# Patient Record
Sex: Male | Born: 2000 | Race: Black or African American | Hispanic: No | Smoking: Former smoker
Health system: Southern US, Community
[De-identification: ages and names within clinical notes are randomized; demographics above are authoritative.]

## PROBLEM LIST (undated history)

## (undated) DIAGNOSIS — K219 Gastro-esophageal reflux disease without esophagitis: Secondary | ICD-10-CM

## (undated) DIAGNOSIS — Z9710 Presence of artificial limb (complete) (partial), unspecified: Secondary | ICD-10-CM

## (undated) DIAGNOSIS — F4310 Post-traumatic stress disorder, unspecified: Secondary | ICD-10-CM

## (undated) DIAGNOSIS — E119 Type 2 diabetes mellitus without complications: Secondary | ICD-10-CM

## (undated) DIAGNOSIS — Z89512 Acquired absence of left leg below knee: Secondary | ICD-10-CM

## (undated) DIAGNOSIS — F319 Bipolar disorder, unspecified: Secondary | ICD-10-CM

## (undated) DIAGNOSIS — J45909 Unspecified asthma, uncomplicated: Secondary | ICD-10-CM

## (undated) DIAGNOSIS — J302 Other seasonal allergic rhinitis: Secondary | ICD-10-CM

## (undated) DIAGNOSIS — E669 Obesity, unspecified: Secondary | ICD-10-CM

## (undated) DIAGNOSIS — F32A Depression, unspecified: Secondary | ICD-10-CM

## (undated) HISTORY — PX: LEG AMPUTATION BELOW KNEE: SHX694

## (undated) HISTORY — PX: OTHER SURGICAL HISTORY: SHX169

## (undated) HISTORY — PX: BELOW KNEE LEG AMPUTATION: SUR23

---
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
## 2022-05-13 ENCOUNTER — Ambulatory Visit (HOSPITAL_COMMUNITY)
Admission: EM | Admit: 2022-05-13 | Discharge: 2022-05-13 | Discharge status: Against Medical Advice | Payer: Medicaid Other
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
## 2022-07-15 ENCOUNTER — Ambulatory Visit (HOSPITAL_COMMUNITY): Payer: Self-pay

## 2022-07-22 ENCOUNTER — Ambulatory Visit (HOSPITAL_COMMUNITY)
Admission: EM | Admit: 2022-07-22 | Discharge: 2022-07-22 | Disposition: A | Discharge status: Home / Self Care | Payer: Medicaid Other | Attending: Family Medicine | Admitting: Family Medicine

## 2022-07-22 ENCOUNTER — Ambulatory Visit (HOSPITAL_COMMUNITY): Payer: Self-pay

## 2022-07-22 ENCOUNTER — Inpatient Hospital Stay: Admission: RE | Admit: 2022-07-22 | Payer: Self-pay | Source: Ambulatory Visit

## 2022-07-22 ENCOUNTER — Encounter (HOSPITAL_COMMUNITY): Payer: Self-pay | Admitting: Emergency Medicine

## 2022-07-22 DIAGNOSIS — L02415 Cutaneous abscess of right lower limb: Secondary | ICD-10-CM | POA: Diagnosis present

## 2022-07-22 DIAGNOSIS — Z113 Encounter for screening for infections with a predominantly sexual mode of transmission: Secondary | ICD-10-CM | POA: Insufficient documentation

## 2022-07-22 MED ORDER — HYDROCODONE-ACETAMINOPHEN 5-325 MG PO TABS: 1.0000 | ORAL_TABLET | Freq: Four times a day (QID) | ORAL | 0 refills | Status: DC | PRN

## 2022-07-22 MED ORDER — DOXYCYCLINE HYCLATE 100 MG PO CAPS: 100.0000 mg | ORAL_CAPSULE | Freq: Two times a day (BID) | ORAL | 0 refills | Status: DC

## 2022-07-22 NOTE — Discharge Instructions (Signed)
We have sent testing for sexually transmitted infections. We will notify you of any positive results once they are received. If required, we will prescribe any medications you might need.  Please refrain from all sexual activity for at least the next seven days.  

## 2022-07-22 NOTE — ED Triage Notes (Signed)
Pt requesting STD testing. Denies s/s or exposure.   Has boil on right inner upper thigh that would like checked out. Denies drainage.

## 2022-07-23 NOTE — ED Provider Notes (Signed)
[facility]   [id number] 07/22/22 Arrival Time: 1854  ASSESSMENT & PLAN:  1. Abscess of right thigh   2. Screening for STDs (sexually transmitted diseases)     Incision and Drainage Procedure Note  Anesthesia: 2% plain lidocaine  Procedure Details  The procedure, risks and complications have been discussed in detail (including, but not limited to pain and bleeding) with the patient.  The skin induration was prepped and draped in the usual fashion. After adequate local anesthesia, I&D with a #11 blade was performed on the right inner upper thigh with bloody, purulent drainage.  EBL: minimal Drains: none Packing: 1/4" iodoform Condition: Tolerated procedure well Complications: none.  Meds ordered this encounter  Medications   doxycycline (VIBRAMYCIN) 100 MG capsule    Sig: Take 1 capsule (100 mg total) by mouth 2 (two) times daily.    Dispense:  14 capsule    Refill:  0   HYDROcodone-acetaminophen (NORCO/VICODIN) 5-325 MG tablet    Sig: Take 1 tablet by mouth every 6 (six) hours as needed for moderate pain or severe pain.    Dispense:  8 tablet    Refill:  0   Labs Pending  CYTOLOGY, (ORAL, ANAL, URETHRAL) ANCILLARY ONLY   Wound care instructions discussed and given in written format. To return in 48 hours for wound check.  Finish all antibiotics. OTC analgesics as needed.  Reviewed expectations re: course of current medical issues. Questions answered. Outlined signs and symptoms indicating need for more acute intervention. Patient verbalized understanding. After Visit Summary given.   SUBJECTIVE:  Andrew Pittman is a 22 y.o. male who presents with a possible infection of his R upper inner thigh. Onset gradual, approximately several days ago without active drainage and without active bleeding. Symptoms have gradually worsened since beginning. Fever: absent. OTC/home treatment: none.   OBJECTIVE:  Vitals:   07/22/22 1911  BP: 130/76  Pulse: 60   Resp: 15  Temp: 98.9 F (37.2 C)  TempSrc: Oral  SpO2: 96%     General appearance: alert; no distress Skin: approx 1 x 2 cm induration of his R upper inner thigh; tender to touch; no active drainage or bleeding Psychological: alert and cooperative; normal mood and affect  Allergies  Allergen Reactions   Shellfish Allergy Swelling and Rash    "shrimp"    Past Medical History:  Diagnosis Date   Acid reflux    Asthma    Diabetes mellitus without complication (HCC)    H/O implantation of prosthetic limb device    Seasonal allergies    Social History   Socioeconomic History   Marital status: Single    Spouse name: Not on file   Number of children: Not on file   Years of education: Not on file   Highest education level: Not on file  Occupational History   Not on file  Tobacco Use   Smoking status: Every Day   Smokeless tobacco: Never  Substance and Sexual Activity   Alcohol use: No   Drug use: Yes    Types: Marijuana   Sexual activity: Never  Other Topics Concern   Not on file  Social History Narrative   ** Merged History Encounter **       Social Determinants of Health   Financial Resource Strain: Not on file  Food Insecurity: Not on file  Transportation Needs: Not on file  Physical Activity: Not on file  Stress: Not on file  Social Connections: Not on file   No  family history on file. Past Surgical History:  Procedure Laterality Date   BELOW KNEE LEG AMPUTATION Left    LEG AMPUTATION BELOW KNEE     metal plate     to right arm and left hip            Mardella Layman, MD 07/23/22 [phone]

## 2022-07-24 ENCOUNTER — Telehealth (HOSPITAL_COMMUNITY): Payer: Self-pay | Admitting: Emergency Medicine

## 2022-07-24 LAB — CYTOLOGY, (ORAL, ANAL, URETHRAL) ANCILLARY ONLY
Chlamydia: NEGATIVE
Comment: NEGATIVE
Comment: NEGATIVE
Comment: NORMAL
Neisseria Gonorrhea: NEGATIVE
Trichomonas: POSITIVE — AB

## 2022-07-26 ENCOUNTER — Telehealth: Payer: Self-pay

## 2022-07-26 MED ORDER — METRONIDAZOLE 500 MG PO TABS: 2000.0000 mg | ORAL_TABLET | Freq: Once | ORAL | 0 refills | Status: AC

## 2022-07-26 MED ORDER — METRONIDAZOLE 500 MG PO TABS: 2000.0000 mg | ORAL_TABLET | Freq: Once | ORAL | 0 refills | Status: DC

## 2022-07-26 NOTE — Telephone Encounter (Signed)
Rx sent to different pharmacy per pt request.

## 2022-07-26 NOTE — Telephone Encounter (Signed)
Per protocol, pt requires tx with Metronidazole. Reviewed with patient, verified pharmacy, prescription sent

## 2022-08-18 ENCOUNTER — Ambulatory Visit (HOSPITAL_COMMUNITY): Payer: Self-pay

## 2022-08-19 ENCOUNTER — Ambulatory Visit (HOSPITAL_COMMUNITY): Payer: Self-pay

## 2022-08-29 ENCOUNTER — Encounter (HOSPITAL_COMMUNITY): Payer: Self-pay | Admitting: Emergency Medicine

## 2022-08-29 ENCOUNTER — Ambulatory Visit (HOSPITAL_COMMUNITY)
Admission: EM | Admit: 2022-08-29 | Discharge: 2022-08-29 | Disposition: A | Discharge status: Home / Self Care | Payer: Medicaid Other | Attending: Physician Assistant | Admitting: Physician Assistant

## 2022-08-29 ENCOUNTER — Other Ambulatory Visit: Payer: Self-pay

## 2022-08-29 DIAGNOSIS — Z202 Contact with and (suspected) exposure to infections with a predominantly sexual mode of transmission: Secondary | ICD-10-CM | POA: Diagnosis not present

## 2022-08-29 DIAGNOSIS — T162XXA Foreign body in left ear, initial encounter: Secondary | ICD-10-CM | POA: Diagnosis not present

## 2022-08-29 MED ORDER — LIDOCAINE HCL (PF) 1 % IJ SOLN
INTRAMUSCULAR | Status: AC
  Filled 2022-08-29: qty 2

## 2022-08-29 MED ORDER — CEFTRIAXONE SODIUM 500 MG IJ SOLR: 500.0000 mg | INTRAMUSCULAR | Status: DC

## 2022-08-29 MED ORDER — DOXYCYCLINE HYCLATE 100 MG PO CAPS: 100.0000 mg | ORAL_CAPSULE | Freq: Two times a day (BID) | ORAL | 0 refills | Status: AC

## 2022-08-29 NOTE — Discharge Instructions (Addendum)
Keep ear clean, clean with regular soap and water, can keep covered If you develop increased swelling, pain, or drainage return for evaluation   Will call with test results and changed treatment plan if indicated Abstain from sexual activity until test results are back and all medication completed.

## 2022-08-29 NOTE — ED Triage Notes (Signed)
Denies symptoms .  Partner positive for gonorrhea and chlamydia.  Patient has the back of a earring in left ear lobe. Patient found out about this 2 days ago.  Patient tried to manipulate foreign body with tweezers.
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
## 2022-08-31 LAB — CYTOLOGY, (ORAL, ANAL, URETHRAL) ANCILLARY ONLY
Chlamydia: NEGATIVE
Comment: NEGATIVE
Comment: NEGATIVE
Comment: NORMAL
Neisseria Gonorrhea: NEGATIVE
Trichomonas: NEGATIVE

## 2022-10-06 ENCOUNTER — Ambulatory Visit (INDEPENDENT_AMBULATORY_CARE_PROVIDER_SITE_OTHER): Payer: Medicaid Other | Admitting: Orthopedic Surgery

## 2022-10-06 DIAGNOSIS — Z89512 Acquired absence of left leg below knee: Secondary | ICD-10-CM

## 2022-10-08 ENCOUNTER — Encounter: Payer: Self-pay | Admitting: Orthopedic Surgery
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
## 2022-11-18 ENCOUNTER — Ambulatory Visit (HOSPITAL_COMMUNITY): Payer: Medicaid Other

## 2022-11-23 ENCOUNTER — Ambulatory Visit (HOSPITAL_COMMUNITY): Payer: Medicaid Other

## 2022-11-27 ENCOUNTER — Encounter (HOSPITAL_COMMUNITY): Payer: Self-pay

## 2022-11-27 ENCOUNTER — Ambulatory Visit (HOSPITAL_COMMUNITY)
Admission: EM | Admit: 2022-11-27 | Discharge: 2022-11-27 | Disposition: A | Discharge status: Home / Self Care | Payer: Medicaid Other | Attending: Emergency Medicine | Admitting: Emergency Medicine

## 2022-11-27 DIAGNOSIS — Z202 Contact with and (suspected) exposure to infections with a predominantly sexual mode of transmission: Secondary | ICD-10-CM | POA: Insufficient documentation

## 2022-11-27 DIAGNOSIS — Z113 Encounter for screening for infections with a predominantly sexual mode of transmission: Secondary | ICD-10-CM | POA: Diagnosis present

## 2022-11-27 LAB — HIV ANTIBODY (ROUTINE TESTING W REFLEX): HIV Screen 4th Generation wRfx: NONREACTIVE
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
## 2022-11-27 NOTE — Discharge Instructions (Signed)
Today you have been screened for gonorrhea, chlamydia, trichomoniasis, HIV and syphilis.  Please return to clinic if you develop any painful lesions or sores, as then we can test you for herpes.  Abstain from intercourse until results have been received.  If you have intercourse in the future suggest a condom, this is the best way to prevent sexually transmitted infections.

## 2022-11-27 NOTE — ED Triage Notes (Signed)
Pt presents to the office for std testing. Pt denies any symptoms.

## 2022-11-28 ENCOUNTER — Ambulatory Visit (HOSPITAL_COMMUNITY): Payer: Medicaid Other

## 2022-11-28 LAB — RPR: RPR Ser Ql: NONREACTIVE

## 2022-11-30 ENCOUNTER — Telehealth (HOSPITAL_COMMUNITY): Payer: Self-pay | Admitting: Emergency Medicine

## 2022-11-30 LAB — CYTOLOGY, (ORAL, ANAL, URETHRAL) ANCILLARY ONLY
Chlamydia: POSITIVE — AB
Comment: NEGATIVE
Comment: NEGATIVE
Comment: NORMAL
Neisseria Gonorrhea: NEGATIVE
Trichomonas: NEGATIVE

## 2022-11-30 MED ORDER — DOXYCYCLINE HYCLATE 100 MG PO CAPS: 100.0000 mg | ORAL_CAPSULE | Freq: Two times a day (BID) | ORAL | 0 refills | Status: DC

## 2022-11-30 NOTE — Telephone Encounter (Signed)
Sent in doxycycline for positive chlamydia results.

## 2023-01-17 ENCOUNTER — Ambulatory Visit (HOSPITAL_COMMUNITY)
Admission: EM | Admit: 2023-01-17 | Discharge: 2023-01-18 | Disposition: A | Discharge status: Home / Self Care | Payer: Medicaid Other | Attending: Nurse Practitioner | Admitting: Nurse Practitioner

## 2023-01-17 DIAGNOSIS — F331 Major depressive disorder, recurrent, moderate: Secondary | ICD-10-CM

## 2023-01-17 MED ORDER — DIVALPROEX SODIUM 250 MG PO DR TAB: 250.0000 mg | DELAYED_RELEASE_TABLET | Freq: Two times a day (BID) | ORAL | 0 refills | Status: DC

## 2023-01-17 MED ORDER — MIRTAZAPINE 7.5 MG PO TABS: 7.5000 mg | ORAL_TABLET | Freq: Every day | ORAL | 0 refills | Status: DC
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
## 2023-01-17 NOTE — Discharge Instructions (Signed)
°  Discharge recommendations:  Patient is to take medications as prescribed. Please see information for follow-up appointment with psychiatry and therapy. Please follow up with your primary care provider for all medical related needs.  Follow up at [facility] outpatient clinic for ongoing medication management and outpatient therapy services.  Therapy: We recommend that patient participate in individual therapy to address mental health concerns.  Medications: The patient or guardian is to contact a medical professional and/or outpatient provider to address any new side effects that develop. The patient or guardian should update outpatient providers of any new medications and/or medication changes.   Atypical antipsychotics: If you are prescribed an atypical antipsychotic, it is recommended that your height, weight, BMI, blood pressure, fasting lipid panel, and fasting blood sugar be monitored by your outpatient providers.  Safety:  The patient should abstain from use of illicit substances/drugs and abuse of any medications. If symptoms worsen or do not continue to improve or if the patient becomes actively suicidal or homicidal then it is recommended that the patient return to the closest hospital emergency department, the [facility], or call 911 for further evaluation and treatment. National Suicide Prevention Lifeline 1-800-SUICIDE or 540-695-9722.  About 988 988 offers 24/7 access to trained crisis counselors who can help people experiencing mental health-related distress. People can call or text 988 or chat 988lifeline.org for themselves or if they are worried about a loved one who may need crisis support.  Crisis Mobile: [facility]:                     (424)731-1125 (for crisis response 24 hours a day) [facility] Hotline:                                            (781) 154-7965
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
## 2023-03-17 ENCOUNTER — Emergency Department (HOSPITAL_COMMUNITY): Payer: Medicaid Other

## 2023-03-17 ENCOUNTER — Emergency Department (HOSPITAL_COMMUNITY): Payer: Medicaid Other | Admitting: Anesthesiology

## 2023-03-17 ENCOUNTER — Encounter (HOSPITAL_COMMUNITY)
Admission: EM | Disposition: A | Discharge status: Other Acute Inpatient Hospital | Payer: Self-pay | Source: Home / Self Care

## 2023-03-17 ENCOUNTER — Other Ambulatory Visit: Payer: Self-pay

## 2023-03-17 ENCOUNTER — Inpatient Hospital Stay (HOSPITAL_COMMUNITY)
Admission: EM | Admit: 2023-03-17 | Discharge: 2023-03-20 | DRG: 500 | Disposition: A | Discharge status: Other Acute Inpatient Hospital | Payer: Medicaid Other | Attending: General Surgery | Admitting: General Surgery

## 2023-03-17 DIAGNOSIS — S5412XA Injury of median nerve at forearm level, left arm, initial encounter: Secondary | ICD-10-CM | POA: Diagnosis present

## 2023-03-17 DIAGNOSIS — Y249XXA Unspecified firearm discharge, undetermined intent, initial encounter: Secondary | ICD-10-CM

## 2023-03-17 DIAGNOSIS — R451 Restlessness and agitation: Secondary | ICD-10-CM | POA: Diagnosis present

## 2023-03-17 DIAGNOSIS — S52352C Displaced comminuted fracture of shaft of radius, left arm, initial encounter for open fracture type IIIA, IIIB, or IIIC: Principal | ICD-10-CM | POA: Diagnosis present

## 2023-03-17 DIAGNOSIS — W3400XA Accidental discharge from unspecified firearms or gun, initial encounter: Secondary | ICD-10-CM | POA: Diagnosis not present

## 2023-03-17 DIAGNOSIS — Z23 Encounter for immunization: Secondary | ICD-10-CM

## 2023-03-17 DIAGNOSIS — Z89512 Acquired absence of left leg below knee: Secondary | ICD-10-CM | POA: Diagnosis not present

## 2023-03-17 DIAGNOSIS — S52252C Displaced comminuted fracture of shaft of ulna, left arm, initial encounter for open fracture type IIIA, IIIB, or IIIC: Secondary | ICD-10-CM | POA: Diagnosis present

## 2023-03-17 DIAGNOSIS — I959 Hypotension, unspecified: Secondary | ICD-10-CM | POA: Diagnosis not present

## 2023-03-17 DIAGNOSIS — S51802A Unspecified open wound of left forearm, initial encounter: Secondary | ICD-10-CM

## 2023-03-17 DIAGNOSIS — D62 Acute posthemorrhagic anemia: Secondary | ICD-10-CM | POA: Diagnosis present

## 2023-03-17 DIAGNOSIS — J9602 Acute respiratory failure with hypercapnia: Secondary | ICD-10-CM | POA: Diagnosis present

## 2023-03-17 DIAGNOSIS — S51832A Puncture wound without foreign body of left forearm, initial encounter: Secondary | ICD-10-CM | POA: Diagnosis present

## 2023-03-17 DIAGNOSIS — T1490XA Injury, unspecified, initial encounter: Principal | ICD-10-CM

## 2023-03-17 HISTORY — DX: Post-traumatic stress disorder, unspecified: F43.10

## 2023-03-17 HISTORY — DX: Obesity, unspecified: E66.9

## 2023-03-17 HISTORY — PX: FASCIOTOMY: SHX132

## 2023-03-17 HISTORY — PX: I & D EXTREMITY: SHX5045

## 2023-03-17 HISTORY — PX: CARPAL TUNNEL RELEASE: SHX101

## 2023-03-17 HISTORY — PX: EXTERNAL FIXATION ARM: SHX1552

## 2023-03-17 HISTORY — DX: Acquired absence of left leg below knee: Z89.512

## 2023-03-17 HISTORY — PX: APPLICATION OF WOUND VAC: SHX5189

## 2023-03-17 HISTORY — DX: Bipolar disorder, unspecified: F31.9

## 2023-03-17 HISTORY — DX: Depression, unspecified: F32.A

## 2023-03-17 LAB — COMPREHENSIVE METABOLIC PANEL
ALT: 23 U/L (ref 0–44)
AST: 25 U/L (ref 15–41)
Albumin: 3.7 g/dL (ref 3.5–5.0)
Alkaline Phosphatase: 50 U/L (ref 38–126)
Anion gap: 17 — ABNORMAL HIGH (ref 5–15)
BUN: 12 mg/dL (ref 6–20)
CO2: 18 mmol/L — ABNORMAL LOW (ref 22–32)
Calcium: 9.1 mg/dL (ref 8.9–10.3)
Chloride: 106 mmol/L (ref 98–111)
Creatinine, Ser: 1.06 mg/dL (ref 0.61–1.24)
GFR, Estimated: >60 mL/min (ref >60)
Glucose, Bld: 131 mg/dL — ABNORMAL HIGH (ref 70–99)
Potassium: 3.7 mmol/L (ref 3.5–5.1)
Sodium: 141 mmol/L (ref 135–145)
Total Bilirubin: 0.4 mg/dL (ref 0.0–1.2)
Total Protein: 6.2 g/dL — ABNORMAL LOW (ref 6.5–8.1)

## 2023-03-17 LAB — CBC
HCT: 44.8 % (ref 39.0–52.0)
Hemoglobin: 14.3 g/dL (ref 13.0–17.0)
MCH: 27.2 pg (ref 26.0–34.0)
MCHC: 31.9 g/dL (ref 30.0–36.0)
MCV: 85.2 fL (ref 80.0–100.0)
Platelets: 296 10*3/uL (ref 150–400)
RBC: 5.26 MIL/uL (ref 4.22–5.81)
RDW: 14.5 % (ref 11.5–15.5)
WBC: 8.3 10*3/uL (ref 4.0–10.5)
nRBC: 0 % (ref 0.0–0.2)

## 2023-03-17 LAB — ABO/RH: ABO/RH(D): O POS

## 2023-03-17 LAB — PROTIME-INR
INR: 1.1 (ref 0.8–1.2)
Prothrombin Time: 14 s (ref 11.4–15.2)

## 2023-03-17 LAB — I-STAT CG4 LACTIC ACID, ED: Lactic Acid, Venous: 3.6 mmol/L (ref 0.5–1.9)

## 2023-03-17 LAB — ETHANOL: Alcohol, Ethyl (B): <10 mg/dL (ref <10)

## 2023-03-17 SURGERY — IRRIGATION AND DEBRIDEMENT EXTREMITY
Anesthesia: General | Site: Arm Lower | Laterality: Left

## 2023-03-17 MED ORDER — MIDAZOLAM HCL 2 MG/2ML IJ SOLN
INTRAMUSCULAR | Status: DC | PRN
  Administered 2023-03-17: 2 mg via INTRAVENOUS

## 2023-03-17 MED ORDER — SODIUM CHLORIDE 0.9 % IV SOLN: INTRAVENOUS | Status: DC | PRN

## 2023-03-17 MED ORDER — PROPOFOL 1000 MG/100ML IV EMUL
5.0000 ug/kg/min | INTRAVENOUS | Status: DC
  Administered 2023-03-18: 75 ug/kg/min via INTRAVENOUS
  Administered 2023-03-18: 70 ug/kg/min via INTRAVENOUS
  Administered 2023-03-18: 60 ug/kg/min via INTRAVENOUS
  Administered 2023-03-18: 55 ug/kg/min via INTRAVENOUS
  Administered 2023-03-18: 75 ug/kg/min via INTRAVENOUS
  Administered 2023-03-18: 55 ug/kg/min via INTRAVENOUS
  Administered 2023-03-18: 70 ug/kg/min via INTRAVENOUS
  Administered 2023-03-18: 60 ug/kg/min via INTRAVENOUS
  Administered 2023-03-18: 55 ug/kg/min via INTRAVENOUS
  Administered 2023-03-19: 45 ug/kg/min via INTRAVENOUS
  Administered 2023-03-19: 50 ug/kg/min via INTRAVENOUS
  Filled 2023-03-17 (×10): qty 100

## 2023-03-17 MED ORDER — FENTANYL CITRATE PF 50 MCG/ML IJ SOSY
50.0000 ug | PREFILLED_SYRINGE | Freq: Once | INTRAMUSCULAR | Status: AC
  Administered 2023-03-17: 50 ug via INTRAVENOUS

## 2023-03-17 MED ORDER — MIDAZOLAM HCL 2 MG/2ML IJ SOLN
INTRAMUSCULAR | Status: AC
  Filled 2023-03-17: qty 2

## 2023-03-17 MED ORDER — ROCURONIUM BROMIDE 10 MG/ML (PF) SYRINGE
PREFILLED_SYRINGE | INTRAVENOUS | Status: AC
  Filled 2023-03-17: qty 10

## 2023-03-17 MED ORDER — FENTANYL CITRATE (PF) 250 MCG/5ML IJ SOLN
INTRAMUSCULAR | Status: AC
  Filled 2023-03-17: qty 5

## 2023-03-17 MED ORDER — CEFAZOLIN SODIUM-DEXTROSE 2-3 GM-%(50ML) IV SOLR
INTRAVENOUS | Status: DC | PRN
  Administered 2023-03-17: 2 g via INTRAVENOUS
  Administered 2023-03-17: 1 g via INTRAVENOUS
  Administered 2023-03-18: 2 g via INTRAVENOUS

## 2023-03-17 MED ORDER — 0.9 % SODIUM CHLORIDE (POUR BTL) OPTIME
TOPICAL | Status: DC | PRN
  Administered 2023-03-17: 1000 mL

## 2023-03-17 MED ORDER — FENTANYL CITRATE (PF) 250 MCG/5ML IJ SOLN
INTRAMUSCULAR | Status: DC | PRN
Start: 2023-03-17 — End: 2023-03-18
  Administered 2023-03-17 (×3): 50 ug via INTRAVENOUS

## 2023-03-17 MED ORDER — ROCURONIUM BROMIDE 10 MG/ML (PF) SYRINGE
PREFILLED_SYRINGE | INTRAVENOUS | Status: DC | PRN
  Administered 2023-03-17 (×2): 100 mg via INTRAVENOUS
  Administered 2023-03-18: 50 mg via INTRAVENOUS

## 2023-03-17 MED ORDER — BUPIVACAINE HCL (PF) 0.25 % IJ SOLN
INTRAMUSCULAR | Status: AC
  Filled 2023-03-17: qty 30

## 2023-03-17 MED ORDER — DEXAMETHASONE SODIUM PHOSPHATE 10 MG/ML IJ SOLN
INTRAMUSCULAR | Status: DC | PRN
  Administered 2023-03-17: 10 mg via INTRAVENOUS

## 2023-03-17 MED ORDER — DEXTROSE 5 % IV SOLN
INTRAVENOUS | Status: DC | PRN
  Administered 2023-03-17: 2 g via INTRAVENOUS

## 2023-03-17 MED ORDER — IOHEXOL 350 MG/ML SOLN
75.0000 mL | Freq: Once | INTRAVENOUS | Status: AC | PRN
  Administered 2023-03-17: 75 mL via INTRAVENOUS

## 2023-03-17 MED ORDER — FENTANYL BOLUS VIA INFUSION
50.0000 ug | INTRAVENOUS | Status: DC | PRN
  Administered 2023-03-18: 100 ug via INTRAVENOUS

## 2023-03-17 MED ORDER — CEFTRIAXONE SODIUM 2 G IJ SOLR
INTRAMUSCULAR | Status: AC
  Filled 2023-03-17: qty 20

## 2023-03-17 MED ORDER — LACTATED RINGERS IV SOLN: INTRAVENOUS | Status: DC | PRN

## 2023-03-17 MED ORDER — ROCURONIUM BROMIDE 10 MG/ML (PF) SYRINGE
PREFILLED_SYRINGE | INTRAVENOUS | Status: AC | PRN
  Administered 2023-03-17: 100 mg via INTRAVENOUS

## 2023-03-17 MED ORDER — ETOMIDATE 2 MG/ML IV SOLN
INTRAVENOUS | Status: AC | PRN
  Administered 2023-03-17: 30 mg via INTRAVENOUS

## 2023-03-17 MED ORDER — PROPOFOL 1000 MG/100ML IV EMUL
0.0000 ug/kg/min | INTRAVENOUS | Status: DC
  Administered 2023-03-17: 20 ug/kg/min via INTRAVENOUS

## 2023-03-17 MED ORDER — FENTANYL CITRATE PF 50 MCG/ML IJ SOSY
PREFILLED_SYRINGE | INTRAMUSCULAR | Status: AC
  Filled 2023-03-17: qty 2

## 2023-03-17 MED ORDER — TETANUS-DIPHTH-ACELL PERTUSSIS 5-2.5-18.5 LF-MCG/0.5 IM SUSY
0.5000 mL | PREFILLED_SYRINGE | Freq: Once | INTRAMUSCULAR | Status: AC
  Administered 2023-03-17: 0.5 mL via INTRAMUSCULAR

## 2023-03-17 MED ORDER — SODIUM CHLORIDE 0.9 % IR SOLN
Status: DC | PRN
  Administered 2023-03-17: 9000 mL

## 2023-03-17 MED ORDER — ONDANSETRON HCL 4 MG/2ML IJ SOLN
INTRAMUSCULAR | Status: DC | PRN
  Administered 2023-03-17: 4 mg via INTRAVENOUS

## 2023-03-17 MED ORDER — FENTANYL CITRATE (PF) 100 MCG/2ML IJ SOLN
INTRAMUSCULAR | Status: AC | PRN
  Administered 2023-03-17: 100 ug via INTRAVENOUS

## 2023-03-17 MED ORDER — FENTANYL 2500MCG IN NS 250ML (10MCG/ML) PREMIX INFUSION
50.0000 ug/h | INTRAVENOUS | Status: DC
  Administered 2023-03-17 – 2023-03-18 (×2): 150 ug/h via INTRAVENOUS
  Administered 2023-03-18: 175 ug/h via INTRAVENOUS
  Filled 2023-03-17 (×3): qty 250

## 2023-03-17 MED ORDER — ALBUMIN HUMAN 5 % IV SOLN: INTRAVENOUS | Status: DC | PRN

## 2023-03-17 SURGICAL SUPPLY — 68 items
BAG COUNTER SPONGE SURGICOUNT (BAG) ×3 IMPLANT
BAR GLASS FIBER EXFX 11X150 (EXFIX) ×1 IMPLANT
BAR GLASS FIBER EXFX 11X200 (EXFIX) ×1 IMPLANT
BLADE SURG 15 STRL LF DISP TIS (BLADE) ×2 IMPLANT
BNDG ELASTIC 3INX 5YD STR LF (GAUZE/BANDAGES/DRESSINGS) ×3 IMPLANT
BNDG ELASTIC 4X5.8 VLCR NS LF (GAUZE/BANDAGES/DRESSINGS) ×1 IMPLANT
BNDG ELASTIC 4X5.8 VLCR STR LF (GAUZE/BANDAGES/DRESSINGS) ×3 IMPLANT
BNDG ESMARK 4X9 LF (GAUZE/BANDAGES/DRESSINGS) IMPLANT
BNDG GAUZE DERMACEA FLUFF 4 (GAUZE/BANDAGES/DRESSINGS) ×6 IMPLANT
CANISTER WOUND CARE 500ML ATS (WOUND CARE) ×2 IMPLANT
CLAMP BLUE BAR TO BAR (EXFIX) ×1 IMPLANT
CLAMP BLUE BAR TO PIN (EXFIX) ×3 IMPLANT
CLAMP PIN BLUE 45MM (EXFIX) ×1 IMPLANT
CONNECTOR Y ATS VAC SYSTEM (MISCELLANEOUS) ×1 IMPLANT
CORD BIPOLAR FORCEPS 12FT (ELECTRODE) ×1 IMPLANT
COVER SURGICAL LIGHT HANDLE (MISCELLANEOUS) ×6 IMPLANT
CUFF TOURN SGL QUICK 18X4 (TOURNIQUET CUFF) ×3 IMPLANT
DRAPE C-ARM 42X72 X-RAY (DRAPES) ×1 IMPLANT
DRAPE C-ARMOR (DRAPES) ×1 IMPLANT
DRAPE DERMATAC (DRAPES) ×2 IMPLANT
DRAPE INCISE IOBAN 66X45 STRL (DRAPES) ×2 IMPLANT
DRAPE SURG 17X23 STRL (DRAPES) ×3 IMPLANT
DRSG VAC GRANUFOAM LG (GAUZE/BANDAGES/DRESSINGS) ×1 IMPLANT
DRSG VAC GRANUFOAM MED (GAUZE/BANDAGES/DRESSINGS) ×1 IMPLANT
DURAPREP 26ML APPLICATOR (WOUND CARE) ×3 IMPLANT
GAUZE PACKING IODOFORM 1/4X15 (PACKING) IMPLANT
GAUZE SPONGE 4X4 12PLY STRL (GAUZE/BANDAGES/DRESSINGS) ×6 IMPLANT
GAUZE SPONGE 4X4 12PLY STRL LF (GAUZE/BANDAGES/DRESSINGS) ×1 IMPLANT
GAUZE STRETCH 2X75IN STRL (MISCELLANEOUS) IMPLANT
GAUZE XEROFORM 1X8 LF (GAUZE/BANDAGES/DRESSINGS) ×3 IMPLANT
GLOVE INDICATOR 7.5 STRL GRN (GLOVE) ×2 IMPLANT
GLOVE PROTEXIS LATEX SZ 7.5 (GLOVE) ×12 IMPLANT
GLOVE SURG LATEX 7.5 PF (GLOVE) IMPLANT
GLOVE SURG SS PI 7.0 STRL IVOR (GLOVE) ×3 IMPLANT
GLOVE SURG SYN 8.0 (GLOVE) ×3 IMPLANT
GLOVE SURG SYN 8.0 PF PI (GLOVE) ×2 IMPLANT
GOWN STRL REUS W/ TWL LRG LVL3 (GOWN DISPOSABLE) ×4 IMPLANT
GOWN STRL REUS W/ TWL XL LVL3 (GOWN DISPOSABLE) ×4 IMPLANT
HALF PIN 4MM (EXFIX) ×3 IMPLANT
KIT BASIN OR (CUSTOM PROCEDURE TRAY) ×3 IMPLANT
KIT TURNOVER KIT B (KITS) ×3 IMPLANT
MANIFOLD NEPTUNE II (INSTRUMENTS) ×3 IMPLANT
NDL HYPO 25GX1X1/2 BEV (NEEDLE) IMPLANT
NDL HYPO 25X1 1.5 SAFETY (NEEDLE) ×2 IMPLANT
NEEDLE HYPO 25GX1X1/2 BEV (NEEDLE) IMPLANT
NEEDLE HYPO 25X1 1.5 SAFETY (NEEDLE) ×3 IMPLANT
NS IRRIG 1000ML POUR BTL (IV SOLUTION) ×3 IMPLANT
PACK ORTHO EXTREMITY (CUSTOM PROCEDURE TRAY) ×3 IMPLANT
PAD ARMBOARD 7.5X6 YLW CONV (MISCELLANEOUS) ×6 IMPLANT
PAD CAST 4YDX4 CTTN HI CHSV (CAST SUPPLIES) ×7 IMPLANT
PIN HALF YELLOW 5X160X35 (EXFIX) ×1 IMPLANT
SLEEVE SURGEON STRL (DRAPES) ×1 IMPLANT
SPIKE FLUID TRANSFER (MISCELLANEOUS) ×3 IMPLANT
SPONGE LAP 18X18 X RAY DECT (DISPOSABLE) ×2 IMPLANT
SPONGE T-LAP 18X18 ~~LOC~~+RFID (SPONGE) ×5 IMPLANT
STAPLER SKIN PROX WIDE 3.9 (STAPLE) ×3 IMPLANT
SUT ETHILON 2 0 FS 18 (SUTURE) ×1 IMPLANT
SUT ETHILON 3 0 PS 1 (SUTURE) ×1 IMPLANT
SUT VICRYL RAPIDE 4/0 PS 2 (SUTURE) IMPLANT
SWAB COLLECTION DEVICE MRSA (MISCELLANEOUS) ×3 IMPLANT
SWAB CULTURE ESWAB REG 1ML (MISCELLANEOUS) IMPLANT
SYR CONTROL 10ML LL (SYRINGE) ×3 IMPLANT
TOWEL GREEN STERILE (TOWEL DISPOSABLE) ×4 IMPLANT
TOWEL GREEN STERILE FF (TOWEL DISPOSABLE) ×3 IMPLANT
TUBE CONNECTING 12X1/4 (SUCTIONS) ×3 IMPLANT
TUBE CONNECTING 20X1/4 (TUBING) ×1 IMPLANT
UNDERPAD 30X36 HEAVY ABSORB (UNDERPADS AND DIAPERS) ×5 IMPLANT
YANKAUER SUCT BULB TIP NO VENT (SUCTIONS) ×3 IMPLANT
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
## 2023-03-17 NOTE — ED Notes (Signed)
Vascular at beside.

## 2023-03-17 NOTE — ED Notes (Signed)
 Family at bedside.
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
## 2023-03-17 NOTE — Anesthesia Procedure Notes (Signed)
 Date/Time: 03/17/2023 9:18 PM  Performed by: Randon Goldsmith, CRNAOxygen Delivery Method: Circle system utilized Preoxygenation: Pre-oxygenation with 100% oxygen Induction Type: Inhalational induction with existing ETT Dental Injury: Teeth and Oropharynx as per pre-operative assessment

## 2023-03-17 NOTE — Procedures (Signed)
 Central line  Date/Time: 03/17/2023 11:57 PM  Performed by: Lysle Rubens, MD Authorized by: Lysle Rubens, MD   Consent:    Consent obtained:  Emergent situation Pre-procedure details:    Indication(s): insufficient peripheral access     Hand hygiene: Hand hygiene performed prior to insertion     Sterile barrier technique: All elements of maximal sterile technique followed     Skin preparation:  Chlorhexidine Sedation:    Sedation type:  Deep Procedure details:    Location:  L femoral   Site selection rationale:  Clearer landmarks seen with [place] on left than right, femoral easiest access point in this particular emergent setting.   Procedural supplies:  Triple lumen   Landmarks identified: yes     Ultrasound guidance: yes     Ultrasound guidance timing: real time     Sterile ultrasound techniques: Sterile gel and sterile probe covers were used     Number of attempts:  1   Successful placement: yes   Post-procedure details:    Post-procedure:  Dressing applied and line sutured   Assessment:  Blood return through all ports   Procedure completion:  Tolerated

## 2023-03-17 NOTE — ED Notes (Signed)
 Ancef started in CT
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
## 2023-03-17 NOTE — Progress Notes (Signed)
 Transported patient to CT and back to trauma A without event.

## 2023-03-18 ENCOUNTER — Inpatient Hospital Stay (HOSPITAL_COMMUNITY): Payer: Medicaid Other

## 2023-03-18 ENCOUNTER — Other Ambulatory Visit: Payer: Self-pay | Admitting: Orthopedic Surgery

## 2023-03-18 ENCOUNTER — Encounter: Payer: Self-pay | Admitting: Orthopedic Surgery

## 2023-03-18 ENCOUNTER — Encounter (HOSPITAL_COMMUNITY): Payer: Self-pay | Admitting: Orthopedic Surgery

## 2023-03-18 DIAGNOSIS — S52202C Unspecified fracture of shaft of left ulna, initial encounter for open fracture type IIIA, IIIB, or IIIC: Secondary | ICD-10-CM | POA: Insufficient documentation

## 2023-03-18 DIAGNOSIS — W3400XA Accidental discharge from unspecified firearms or gun, initial encounter: Secondary | ICD-10-CM

## 2023-03-18 LAB — CBC
HCT: 39.2 % (ref 39.0–52.0)
HCT: 39.4 % (ref 39.0–52.0)
HCT: 31.2 % — ABNORMAL LOW (ref 39.0–52.0)
Hemoglobin: 13.2 g/dL (ref 13.0–17.0)
Hemoglobin: 13.5 g/dL (ref 13.0–17.0)
Hemoglobin: 10.7 g/dL — ABNORMAL LOW (ref 13.0–17.0)
MCH: 27.6 pg (ref 26.0–34.0)
MCH: 28 pg (ref 26.0–34.0)
MCH: 27.9 pg (ref 26.0–34.0)
MCHC: 33.7 g/dL (ref 30.0–36.0)
MCHC: 34.3 g/dL (ref 30.0–36.0)
MCHC: 34.3 g/dL (ref 30.0–36.0)
MCV: 81.8 fL (ref 80.0–100.0)
MCV: 81.6 fL (ref 80.0–100.0)
MCV: 81.3 fL (ref 80.0–100.0)
Platelets: 245 10*3/uL (ref 150–400)
Platelets: 234 10*3/uL (ref 150–400)
Platelets: 172 10*3/uL (ref 150–400)
RBC: 4.79 MIL/uL (ref 4.22–5.81)
RBC: 4.83 MIL/uL (ref 4.22–5.81)
RBC: 3.84 MIL/uL — ABNORMAL LOW (ref 4.22–5.81)
RDW: 14.8 % (ref 11.5–15.5)
RDW: 14.8 % (ref 11.5–15.5)
RDW: 14.8 % (ref 11.5–15.5)
WBC: 16.3 10*3/uL — ABNORMAL HIGH (ref 4.0–10.5)
WBC: 16.5 10*3/uL — ABNORMAL HIGH (ref 4.0–10.5)
WBC: 9 10*3/uL (ref 4.0–10.5)
nRBC: 0 % (ref 0.0–0.2)
nRBC: 0 % (ref 0.0–0.2)
nRBC: 0 % (ref 0.0–0.2)

## 2023-03-18 LAB — POCT I-STAT 7, (LYTES, BLD GAS, ICA,H+H)
Acid-base deficit: 1 mmol/L (ref 0.0–2.0)
Acid-base deficit: 1 mmol/L (ref 0.0–2.0)
Bicarbonate: 26.6 mmol/L (ref 20.0–28.0)
Bicarbonate: 24.3 mmol/L (ref 20.0–28.0)
Calcium, Ion: 1.12 mmol/L — ABNORMAL LOW (ref 1.15–1.40)
Calcium, Ion: 1.11 mmol/L — ABNORMAL LOW (ref 1.15–1.40)
HCT: 40 % (ref 39.0–52.0)
HCT: 37 % — ABNORMAL LOW (ref 39.0–52.0)
Hemoglobin: 13.6 g/dL (ref 13.0–17.0)
Hemoglobin: 12.6 g/dL — ABNORMAL LOW (ref 13.0–17.0)
O2 Saturation: 100 %
O2 Saturation: 98 %
Patient temperature: 37.2
Patient temperature: 37.4
Potassium: 4.4 mmol/L (ref 3.5–5.1)
Potassium: 4.2 mmol/L (ref 3.5–5.1)
Sodium: 137 mmol/L (ref 135–145)
Sodium: 140 mmol/L (ref 135–145)
TCO2: 28 mmol/L (ref 22–32)
TCO2: 26 mmol/L (ref 22–32)
pCO2 arterial: 58.9 mmHg — ABNORMAL HIGH (ref 32–48)
pCO2 arterial: 42.5 mmHg (ref 32–48)
pH, Arterial: 7.264 — ABNORMAL LOW (ref 7.35–7.45)
pH, Arterial: 7.367 (ref 7.35–7.45)
pO2, Arterial: 278 mmHg — ABNORMAL HIGH (ref 83–108)
pO2, Arterial: 108 mmHg (ref 83–108)

## 2023-03-18 LAB — TYPE AND SCREEN
ABO/RH(D): O POS
Antibody Screen: NEGATIVE
Unit division: 0
Unit division: 0
Unit division: 0
Unit division: 0

## 2023-03-18 LAB — BPAM RBC
Blood Product Expiration Date: 202503192359
Blood Product Expiration Date: 202503192359
Blood Product Expiration Date: 202503212359
Blood Product Expiration Date: 202503212359
ISSUE DATE / TIME: 202502261844
ISSUE DATE / TIME: 202502261845
ISSUE DATE / TIME: 202502262017
ISSUE DATE / TIME: 202502262018
Unit Type and Rh: 5100
Unit Type and Rh: 5100
Unit Type and Rh: 5100
Unit Type and Rh: 5100

## 2023-03-18 LAB — BASIC METABOLIC PANEL
Anion gap: 12 (ref 5–15)
BUN: 7 mg/dL (ref 6–20)
CO2: 24 mmol/L (ref 22–32)
Calcium: 7.9 mg/dL — ABNORMAL LOW (ref 8.9–10.3)
Chloride: 102 mmol/L (ref 98–111)
Creatinine, Ser: 1.18 mg/dL (ref 0.61–1.24)
GFR, Estimated: >60 mL/min (ref >60)
Glucose, Bld: 142 mg/dL — ABNORMAL HIGH (ref 70–99)
Potassium: 4.8 mmol/L (ref 3.5–5.1)
Sodium: 138 mmol/L (ref 135–145)

## 2023-03-18 LAB — POCT I-STAT EG7
Acid-base deficit: 2 mmol/L (ref 0.0–2.0)
Bicarbonate: 25.6 mmol/L (ref 20.0–28.0)
Calcium, Ion: 1.09 mmol/L — ABNORMAL LOW (ref 1.15–1.40)
HCT: 44 % (ref 39.0–52.0)
Hemoglobin: 15 g/dL (ref 13.0–17.0)
O2 Saturation: 80 %
Potassium: 4.2 mmol/L (ref 3.5–5.1)
Sodium: 139 mmol/L (ref 135–145)
TCO2: 27 mmol/L (ref 22–32)
pCO2, Ven: 53 mmHg (ref 44–60)
pH, Ven: 7.293 (ref 7.25–7.43)
pO2, Ven: 50 mmHg — ABNORMAL HIGH (ref 32–45)

## 2023-03-18 LAB — URINALYSIS, ROUTINE W REFLEX MICROSCOPIC
Bacteria, UA: NONE SEEN
Bilirubin Urine: NEGATIVE
Glucose, UA: NEGATIVE mg/dL
Hgb urine dipstick: NEGATIVE
Ketones, ur: NEGATIVE mg/dL
Leukocytes,Ua: NEGATIVE
Nitrite: NEGATIVE
Protein, ur: NEGATIVE mg/dL
Specific Gravity, Urine: 1.013 (ref 1.005–1.030)
pH: 5 (ref 5.0–8.0)

## 2023-03-18 LAB — HIV ANTIBODY (ROUTINE TESTING W REFLEX): HIV Screen 4th Generation wRfx: NONREACTIVE

## 2023-03-18 LAB — PREPARE RBC (CROSSMATCH)

## 2023-03-18 LAB — TRIGLYCERIDES: Triglycerides: 169 mg/dL — ABNORMAL HIGH (ref <150)

## 2023-03-18 LAB — MRSA NEXT GEN BY PCR, NASAL: MRSA by PCR Next Gen: NOT DETECTED

## 2023-03-18 MED ORDER — PANTOPRAZOLE SODIUM 40 MG PO TBEC
40.0000 mg | DELAYED_RELEASE_TABLET | Freq: Every day | ORAL | Status: DC
Start: 2023-03-18 — End: 2023-03-21
  Administered 2023-03-19 – 2023-03-20 (×2): 40 mg via ORAL
  Filled 2023-03-18 (×2): qty 1

## 2023-03-18 MED ORDER — ONDANSETRON 4 MG PO TBDP
4.0000 mg | ORAL_TABLET | Freq: Four times a day (QID) | ORAL | Status: DC | PRN
Start: 2023-03-18 — End: 2023-03-21

## 2023-03-18 MED ORDER — SODIUM CHLORIDE 0.9% IV SOLUTION: Freq: Once | INTRAVENOUS | Status: AC

## 2023-03-18 MED ORDER — ALBUMIN HUMAN 5 % IV SOLN
12.5000 g | Freq: Once | INTRAVENOUS | Status: AC
  Administered 2023-03-18: 12.5 g via INTRAVENOUS
  Filled 2023-03-18: qty 250

## 2023-03-18 MED ORDER — PANTOPRAZOLE SODIUM 40 MG IV SOLR
40.0000 mg | Freq: Every day | INTRAVENOUS | Status: DC
  Administered 2023-03-18: 40 mg via INTRAVENOUS
  Filled 2023-03-18: qty 10

## 2023-03-18 MED ORDER — METHOCARBAMOL 1000 MG/10ML IJ SOLN
500.0000 mg | Freq: Three times a day (TID) | INTRAMUSCULAR | Status: DC
  Administered 2023-03-18 (×2): 500 mg via INTRAVENOUS
  Filled 2023-03-18 (×4): qty 10

## 2023-03-18 MED ORDER — ROCURONIUM BROMIDE 10 MG/ML (PF) SYRINGE
PREFILLED_SYRINGE | INTRAVENOUS | Status: AC
  Filled 2023-03-18: qty 10

## 2023-03-18 MED ORDER — CALCIUM GLUCONATE-NACL 2-0.675 GM/100ML-% IV SOLN
2.0000 g | Freq: Once | INTRAVENOUS | Status: AC
  Administered 2023-03-18: 2000 mg via INTRAVENOUS
  Filled 2023-03-18: qty 100

## 2023-03-18 MED ORDER — HEMOSTATIC AGENTS (NO CHARGE) OPTIME
TOPICAL | Status: DC | PRN
  Administered 2023-03-18: 1 via TOPICAL

## 2023-03-18 MED ORDER — POLYETHYLENE GLYCOL 3350 17 G PO PACK: 17.0000 g | PACK | Freq: Every day | ORAL | Status: DC | PRN

## 2023-03-18 MED ORDER — SUCCINYLCHOLINE CHLORIDE 200 MG/10ML IV SOSY
PREFILLED_SYRINGE | INTRAVENOUS | Status: AC
  Filled 2023-03-18: qty 10

## 2023-03-18 MED ORDER — DOCUSATE SODIUM 100 MG PO CAPS: 100.0000 mg | ORAL_CAPSULE | Freq: Two times a day (BID) | ORAL | Status: DC

## 2023-03-18 MED ORDER — ONDANSETRON HCL 4 MG/2ML IJ SOLN: 4.0000 mg | Freq: Four times a day (QID) | INTRAMUSCULAR | Status: DC | PRN

## 2023-03-18 MED ORDER — HYDRALAZINE HCL 20 MG/ML IJ SOLN: 10.0000 mg | INTRAMUSCULAR | Status: DC | PRN

## 2023-03-18 MED ORDER — DOCUSATE SODIUM 50 MG/5ML PO LIQD
100.0000 mg | Freq: Two times a day (BID) | ORAL | Status: DC
  Administered 2023-03-18 (×2): 100 mg
  Filled 2023-03-18 (×2): qty 10

## 2023-03-18 MED ORDER — CEFAZOLIN SODIUM-DEXTROSE 2-4 GM/100ML-% IV SOLN: 2.0000 g | INTRAVENOUS | Status: DC

## 2023-03-18 MED ORDER — GABAPENTIN 250 MG/5ML PO SOLN
300.0000 mg | Freq: Three times a day (TID) | ORAL | Status: DC
  Administered 2023-03-18 – 2023-03-19 (×4): 300 mg
  Filled 2023-03-18 (×6): qty 6

## 2023-03-18 MED ORDER — ONDANSETRON HCL 4 MG/2ML IJ SOLN
INTRAMUSCULAR | Status: AC
  Filled 2023-03-18: qty 2

## 2023-03-18 MED ORDER — CEFAZOLIN SODIUM 1 G IJ SOLR
INTRAMUSCULAR | Status: AC
  Filled 2023-03-18: qty 20

## 2023-03-18 MED ORDER — OXYCODONE HCL 5 MG PO TABS
10.0000 mg | ORAL_TABLET | Freq: Four times a day (QID) | ORAL | Status: DC
  Administered 2023-03-18 – 2023-03-19 (×4): 10 mg
  Filled 2023-03-18 (×4): qty 2

## 2023-03-18 MED ORDER — CLONAZEPAM 0.5 MG PO TABS
0.5000 mg | ORAL_TABLET | Freq: Two times a day (BID) | ORAL | Status: DC
  Administered 2023-03-18 (×2): 0.5 mg
  Filled 2023-03-18 (×2): qty 1

## 2023-03-18 MED ORDER — ORAL CARE MOUTH RINSE: 15.0000 mL | OROMUCOSAL | Status: DC | PRN

## 2023-03-18 MED ORDER — LACTATED RINGERS IV SOLN: INTRAVENOUS | Status: AC

## 2023-03-18 MED ORDER — PHENYLEPHRINE 80 MCG/ML (10ML) SYRINGE FOR IV PUSH (FOR BLOOD PRESSURE SUPPORT)
PREFILLED_SYRINGE | INTRAVENOUS | Status: DC | PRN
  Administered 2023-03-18 (×6): 80 ug via INTRAVENOUS

## 2023-03-18 MED ORDER — QUETIAPINE FUMARATE 25 MG PO TABS
50.0000 mg | ORAL_TABLET | Freq: Two times a day (BID) | ORAL | Status: DC
  Administered 2023-03-18 (×2): 50 mg
  Filled 2023-03-18 (×2): qty 2

## 2023-03-18 MED ORDER — ACETAMINOPHEN 160 MG/5ML PO SOLN
1000.0000 mg | Freq: Three times a day (TID) | ORAL | Status: DC
  Administered 2023-03-18 – 2023-03-19 (×4): 1000 mg
  Filled 2023-03-18 (×4): qty 40.6

## 2023-03-18 MED ORDER — CHLORHEXIDINE GLUCONATE CLOTH 2 % EX PADS
6.0000 | MEDICATED_PAD | Freq: Every day | CUTANEOUS | Status: DC
  Administered 2023-03-18 – 2023-03-20 (×2): 6 via TOPICAL

## 2023-03-18 MED ORDER — POLYETHYLENE GLYCOL 3350 17 G PO PACK
17.0000 g | PACK | Freq: Every day | ORAL | Status: DC | PRN
  Administered 2023-03-20: 17 g
  Filled 2023-03-18: qty 1

## 2023-03-18 MED ORDER — DEXAMETHASONE SODIUM PHOSPHATE 10 MG/ML IJ SOLN
INTRAMUSCULAR | Status: AC
  Filled 2023-03-18: qty 1

## 2023-03-18 MED ORDER — ORAL CARE MOUTH RINSE
15.0000 mL | OROMUCOSAL | Status: DC
  Administered 2023-03-18 – 2023-03-19 (×15): 15 mL via OROMUCOSAL

## 2023-03-18 MED ORDER — METHOCARBAMOL 500 MG PO TABS
500.0000 mg | ORAL_TABLET | Freq: Three times a day (TID) | ORAL | Status: AC
  Administered 2023-03-18 – 2023-03-20 (×7): 500 mg via ORAL
  Filled 2023-03-18 (×7): qty 1

## 2023-03-18 MED ORDER — CEFAZOLIN SODIUM-DEXTROSE 2-4 GM/100ML-% IV SOLN
2.0000 g | Freq: Three times a day (TID) | INTRAVENOUS | Status: DC
  Administered 2023-03-18 – 2023-03-20 (×9): 2 g via INTRAVENOUS
  Filled 2023-03-18 (×9): qty 100

## 2023-03-18 NOTE — Transfer of Care (Signed)
 Immediate Anesthesia Transfer of Care Note  Patient: Andrew Pittman  Procedure(s) Performed: IRRIGATION AND DEBRIDEMENT LEFT FOREARM (Left: Arm Lower) EXTERNAL FIXATION ARM (Left) APPLICATION OF WOUND VAC (Left: Arm Lower) LEFT FOREARM FASCIOTOMY (Left: Arm Lower) OPEN CARPAL TUNNEL RELEASE LEFT WRIST (Left: Arm Lower)  Patient Location: ICU  Anesthesia Type:General  Level of Consciousness: Patient remains intubated per anesthesia plan  Airway & Oxygen Therapy: Patient remains intubated per anesthesia plan and Patient placed on Ventilator (see vital sign flow sheet for setting)  Post-op Assessment: Report given to RN and Post -op Vital signs reviewed and stable  Post vital signs: Reviewed and stable  Last Vitals:  Vitals Value Taken Time  BP 148/106 03/18/23 0230  Temp    Pulse 91 03/18/23 0239  Resp 18 03/18/23 0239  SpO2 100 % 03/18/23 0239  Vitals shown include unfiled device data.  Last Pain:  Vitals:   03/17/23 1900  TempSrc: Axillary  PainSc:          Complications: No notable events documented.

## 2023-03-18 NOTE — Plan of Care (Signed)
  Problem: Clinical Measurements: Goal: Ability to maintain clinical measurements within normal limits will improve Outcome: Progressing Goal: Diagnostic test results will improve Outcome: Progressing Goal: Cardiovascular complication will be avoided Outcome: Progressing   Problem: Pain Managment: Goal: General experience of comfort will improve and/or be controlled Outcome: Progressing

## 2023-03-18 NOTE — Anesthesia Postprocedure Evaluation (Signed)
 Anesthesia Post Note  Patient: Andrew Pittman  Procedure(s) Performed: IRRIGATION AND DEBRIDEMENT LEFT FOREARM (Left: Arm Lower) EXTERNAL FIXATION ARM (Left) APPLICATION OF WOUND VAC (Left: Arm Lower) LEFT FOREARM FASCIOTOMY (Left: Arm Lower) OPEN CARPAL TUNNEL RELEASE LEFT WRIST (Left: Arm Lower)     Patient location during evaluation: SICU Anesthesia Type: General Level of consciousness: sedated Pain management: pain level controlled Vital Signs Assessment: post-procedure vital signs reviewed and stable Respiratory status: patient remains intubated per anesthesia plan Cardiovascular status: stable Postop Assessment: no apparent nausea or vomiting Anesthetic complications: no   No notable events documented.  Last Vitals:  Vitals:   03/18/23 0647 03/18/23 0700  BP: 118/85 123/76  Pulse: 100 98  Resp: (!) 22 (!) 22  Temp: 37.3 C 37.3 C  SpO2: 100% 100%    Last Pain:  Vitals:   03/18/23 0600  TempSrc: Bladder  PainSc:                  Andrew Pittman
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
## 2023-03-18 NOTE — Progress Notes (Signed)
 Transition of Care [facility]) - CAGE-AID Screening   Patient Details  Name: Andrew Pittman MRN: [id number] Date of Birth: 12/17/2000  [facility] of [facility]) CM/SW Contact:    Leota Sauers, RN Phone Number: 03/18/2023, 4:08 AM   Clinical Narrative:  Patient unable to participate, patient currently intubated.  CAGE-AID Screening: Substance Abuse Screening unable to be completed due to: : Patient unable to participate (patient currently intubated)
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
## 2023-03-18 NOTE — Progress Notes (Addendum)
 Patient ID: Andrew Pittman, male   DOB: 2000-08-30, 23 y.o.   MRN: [id number] Discussed with Dr. Lucienne Capers from hand surgery.  He recommends transfer to [facility] for further specialized hand surgery and likely plastic surgery care.  The hand surgeon there, Dr. Daiva Eves, has agreed to assume the left upper extremity care.  I have contacted the [facility] transfer line for transfer for to the trauma service as the patient remains on the ventilator.  I will have radiology push his images and we will fax a demographic sheet.  I await a callback from their trauma surgeon on-call.  Violeta Gelinas, MD, MPH, FACS Please use [email or web address] to contact on call provider

## 2023-03-19 ENCOUNTER — Encounter (HOSPITAL_COMMUNITY): Payer: Self-pay

## 2023-03-19 ENCOUNTER — Encounter (HOSPITAL_COMMUNITY)
Admission: EM | Disposition: A | Discharge status: Other Acute Inpatient Hospital | Payer: Self-pay | Source: Home / Self Care

## 2023-03-19 LAB — BASIC METABOLIC PANEL
Anion gap: 6 (ref 5–15)
BUN: 9 mg/dL (ref 6–20)
CO2: 28 mmol/L (ref 22–32)
Calcium: 7.8 mg/dL — ABNORMAL LOW (ref 8.9–10.3)
Chloride: 106 mmol/L (ref 98–111)
Creatinine, Ser: 0.96 mg/dL (ref 0.61–1.24)
GFR, Estimated: >60 mL/min (ref >60)
Glucose, Bld: 111 mg/dL — ABNORMAL HIGH (ref 70–99)
Potassium: 3.3 mmol/L — ABNORMAL LOW (ref 3.5–5.1)
Sodium: 140 mmol/L (ref 135–145)

## 2023-03-19 LAB — CBC
HCT: 29 % — ABNORMAL LOW (ref 39.0–52.0)
Hemoglobin: 9.8 g/dL — ABNORMAL LOW (ref 13.0–17.0)
MCH: 27.6 pg (ref 26.0–34.0)
MCHC: 33.8 g/dL (ref 30.0–36.0)
MCV: 81.7 fL (ref 80.0–100.0)
Platelets: 176 10*3/uL (ref 150–400)
RBC: 3.55 MIL/uL — ABNORMAL LOW (ref 4.22–5.81)
RDW: 14.7 % (ref 11.5–15.5)
WBC: 11.7 10*3/uL — ABNORMAL HIGH (ref 4.0–10.5)
nRBC: 0 % (ref 0.0–0.2)

## 2023-03-19 SURGERY — IRRIGATION AND DEBRIDEMENT, OPEN FRACTURE
Anesthesia: General | Laterality: Left

## 2023-03-19 MED ORDER — POTASSIUM CHLORIDE CRYS ER 20 MEQ PO TBCR
40.0000 meq | EXTENDED_RELEASE_TABLET | ORAL | Status: AC
Start: 2023-03-19 — End: 2023-03-19
  Administered 2023-03-19 (×2): 40 meq via ORAL
  Filled 2023-03-19 (×2): qty 2

## 2023-03-19 MED ORDER — OXYCODONE HCL 5 MG PO TABS
5.0000 mg | ORAL_TABLET | ORAL | Status: DC | PRN
  Administered 2023-03-19 – 2023-03-20 (×8): 10 mg via ORAL
  Filled 2023-03-19 (×8): qty 2

## 2023-03-19 MED ORDER — GABAPENTIN 300 MG PO CAPS
300.0000 mg | ORAL_CAPSULE | Freq: Three times a day (TID) | ORAL | Status: DC
  Administered 2023-03-19 – 2023-03-20 (×5): 300 mg via ORAL
  Filled 2023-03-19 (×5): qty 1

## 2023-03-19 MED ORDER — FENTANYL CITRATE PF 50 MCG/ML IJ SOSY
50.0000 ug | PREFILLED_SYRINGE | INTRAMUSCULAR | Status: DC | PRN
  Administered 2023-03-19 – 2023-03-20 (×12): 50 ug via INTRAVENOUS
  Filled 2023-03-19 (×13): qty 1

## 2023-03-19 MED ORDER — LIDOCAINE 5 % EX PTCH
1.0000 | MEDICATED_PATCH | CUTANEOUS | Status: DC
  Administered 2023-03-19 – 2023-03-20 (×2): 1 via TRANSDERMAL
  Filled 2023-03-19 (×2): qty 1

## 2023-03-19 MED ORDER — ACETAMINOPHEN 500 MG PO TABS
1000.0000 mg | ORAL_TABLET | Freq: Three times a day (TID) | ORAL | Status: DC
  Administered 2023-03-19 – 2023-03-20 (×5): 1000 mg via ORAL
  Filled 2023-03-19 (×5): qty 2

## 2023-03-19 MED ORDER — BOOST / RESOURCE BREEZE PO LIQD CUSTOM
1.0000 | Freq: Three times a day (TID) | ORAL | Status: DC
  Administered 2023-03-20: 1 via ORAL

## 2023-03-19 MED ORDER — TRAMADOL HCL 50 MG PO TABS
50.0000 mg | ORAL_TABLET | Freq: Four times a day (QID) | ORAL | Status: DC
  Administered 2023-03-19 – 2023-03-20 (×8): 50 mg via ORAL
  Filled 2023-03-19 (×8): qty 1

## 2023-03-19 MED ORDER — HYDROMORPHONE HCL 1 MG/ML IJ SOLN
1.0000 mg | Freq: Once | INTRAMUSCULAR | Status: AC
  Administered 2023-03-19: 1 mg via INTRAVENOUS
  Filled 2023-03-19: qty 1

## 2023-03-19 MED ORDER — ORAL CARE MOUTH RINSE: 15.0000 mL | OROMUCOSAL | Status: DC | PRN
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
## 2023-03-19 NOTE — Plan of Care (Signed)
  Problem: Clinical Measurements: Goal: Will remain free from infection Outcome: Progressing Goal: Respiratory complications will improve Outcome: Progressing   Problem: Coping: Goal: Level of anxiety will decrease Outcome: Progressing   Problem: Pain Managment: Goal: General experience of comfort will improve and/or be controlled Outcome: Progressing   Problem: Skin Integrity: Goal: Risk for impaired skin integrity will decrease Outcome: Progressing   Problem: Safety: Goal: Non-violent Restraint(s) Outcome: Not Progressing

## 2023-03-19 NOTE — Procedures (Signed)
 Extubation Procedure Note  Patient Details:   Name: Andrew Pittman DOB: 2000/03/21 MRN: [id number]   Airway Documentation:    Vent end date: 03/19/23 Vent end time: 0838   Evaluation  O2 sats: stable throughout Complications: No apparent complications Patient did tolerate procedure well. Bilateral Breath Sounds: Clear   Yes   Pt was extubated per order. Placed on [place] @ 3L. Pt tolerated well. Cuff leak positive prior to ext. Pt was able to state his name. No stridor noted.   Pasty Arch 03/19/2023, 8:44 AM

## 2023-03-19 NOTE — Progress Notes (Signed)
 Patient ID: Andrew Pittman, male   DOB: 03-Mar-2000, 23 y.o.   MRN: [id number] Doing well after extubation. I called [facility]. He is on the waiting list for an [facility] bed.  I updated his mother, GF and him.  Violeta Gelinas, MD, MPH, FACS Please use [email or web address] to contact on call provider
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
## 2023-03-19 NOTE — Progress Notes (Signed)
 Entered in patient's room to find Serosanguineous fluid on patient pillow under his left upper extremity. Wound vac was continuously draining with no leaks detected. It was unable to be determined if the fluid found was from the patient's wound vac site or the pin sites. TRN Autumn was contacted about findings. Patient was given 50 mg Tramadol and one time dose of Dilaudid 1 mg for pain control to unwrap the patient's arm to identify source of bleeding. Old drainage was found on the gauze under the ace wrap, with some new drainage from the pin sites of the external fixator. Patient's LUE was rewrapped with ABD's and new ace wraps. An H&H to be ordered. Family at bedside during duration of event.

## 2023-03-19 NOTE — Progress Notes (Signed)
..  Trauma Event Note    Reason for Call :  Called by primary RN Christian for evaluation of serosanguinous drainage from wound to L arm. Picture placed in chart of drainage new since shift change.  Verbal order obtained from DR. Kinsinger for 1 time admin of Dilaudid 1mg  due to reports of significant pain in hopes of obtaining better assessment of location of bleeding.  After ace wrap removed gauze noted to be soaked but dripping, posterior arm reinforced with abd pads and ace wrap, will obtain new H&H and continue monitor. Family at bedside.   Last imported Vital Signs BP (!) 157/96   Pulse 77   Temp 99 F (37.2 C)   Resp 16   Ht 5\' 10"  (1.778 m)   Wt (!) 322 lb 1.5 oz (146.1 kg)   SpO2 96%   BMI 46.22 kg/m   Trending CBC Recent Labs    03/18/23 0513 03/18/23 0917 03/18/23 1408 03/19/23 0527  WBC 16.5*  --  9.0 11.7*  HGB 13.5  13.6 12.6* 10.7* 9.8*  HCT 39.4  40.0 37.0* 31.2* 29.0*  PLT 234  --  172 176    Trending Coag's Recent Labs    03/17/23 1946  INR 1.1    Trending BMET Recent Labs    03/17/23 1946 03/17/23 2321 03/18/23 0513 03/18/23 0917 03/19/23 0527  NA 141   < > 138  137 140 140  K 3.7   < > 4.8  4.4 4.2 3.3*  CL 106  --  102  --  106  CO2 18*  --  24  --  28  BUN 12  --  7  --  9  CREATININE 1.06  --  1.18  --  0.96  GLUCOSE 131*  --  142*  --  111*   < > = values in this interval not displayed.      Lois Slagel Dee  Trauma Response RN  Please call TRN at [phone] for further assistance.

## 2023-03-20 ENCOUNTER — Inpatient Hospital Stay (HOSPITAL_COMMUNITY)

## 2023-03-20 LAB — BASIC METABOLIC PANEL
Anion gap: 7 (ref 5–15)
BUN: 6 mg/dL (ref 6–20)
CO2: 29 mmol/L (ref 22–32)
Calcium: 8 mg/dL — ABNORMAL LOW (ref 8.9–10.3)
Chloride: 100 mmol/L (ref 98–111)
Creatinine, Ser: 0.72 mg/dL (ref 0.61–1.24)
GFR, Estimated: >60 mL/min (ref >60)
Glucose, Bld: 118 mg/dL — ABNORMAL HIGH (ref 70–99)
Potassium: 3.6 mmol/L (ref 3.5–5.1)
Sodium: 136 mmol/L (ref 135–145)

## 2023-03-20 LAB — CBC
HCT: 26.2 % — ABNORMAL LOW (ref 39.0–52.0)
Hemoglobin: 8.7 g/dL — ABNORMAL LOW (ref 13.0–17.0)
MCH: 28 pg (ref 26.0–34.0)
MCHC: 33.2 g/dL (ref 30.0–36.0)
MCV: 84.2 fL (ref 80.0–100.0)
Platelets: 162 10*3/uL (ref 150–400)
RBC: 3.11 MIL/uL — ABNORMAL LOW (ref 4.22–5.81)
RDW: 14.5 % (ref 11.5–15.5)
WBC: 9.9 10*3/uL (ref 4.0–10.5)
nRBC: 0 % (ref 0.0–0.2)

## 2023-03-20 LAB — HEMOGLOBIN AND HEMATOCRIT, BLOOD
HCT: 26.3 % — ABNORMAL LOW (ref 39.0–52.0)
Hemoglobin: 8.7 g/dL — ABNORMAL LOW (ref 13.0–17.0)

## 2023-03-20 MED ORDER — FENTANYL CITRATE PF 50 MCG/ML IJ SOSY
100.0000 ug | PREFILLED_SYRINGE | INTRAMUSCULAR | Status: DC | PRN
  Administered 2023-03-20 (×7): 100 ug via INTRAVENOUS
  Filled 2023-03-20 (×7): qty 2

## 2023-03-20 MED ORDER — MORPHINE SULFATE 1 MG/ML IV SOLN PCA
INTRAVENOUS | Status: DC
  Filled 2023-03-20 (×2): qty 30

## 2023-03-20 MED ORDER — HYDROMORPHONE 1 MG/ML IV SOLN: INTRAVENOUS | Status: DC

## 2023-03-20 MED ORDER — ONDANSETRON HCL 4 MG/2ML IJ SOLN
4.0000 mg | Freq: Four times a day (QID) | INTRAMUSCULAR | Status: DC | PRN
  Administered 2023-03-20: 4 mg via INTRAVENOUS
  Filled 2023-03-20: qty 2

## 2023-03-20 MED ORDER — SODIUM CHLORIDE 0.9% FLUSH: 9.0000 mL | INTRAVENOUS | Status: DC | PRN

## 2023-03-20 MED ORDER — POLYETHYLENE GLYCOL 3350 17 G PO PACK: 17.0000 g | PACK | Freq: Every day | ORAL | Status: DC | PRN

## 2023-03-20 MED ORDER — NALOXONE HCL 0.4 MG/ML IJ SOLN: 0.4000 mg | INTRAMUSCULAR | Status: DC | PRN

## 2023-03-20 MED ORDER — DIPHENHYDRAMINE HCL 50 MG/ML IJ SOLN: 12.5000 mg | Freq: Four times a day (QID) | INTRAMUSCULAR | Status: DC | PRN

## 2023-03-20 MED ORDER — DIPHENHYDRAMINE HCL 12.5 MG/5ML PO ELIX: 12.5000 mg | ORAL_SOLUTION | Freq: Four times a day (QID) | ORAL | Status: DC | PRN

## 2023-03-20 NOTE — Progress Notes (Deleted)
 Duplicate note

## 2023-03-20 NOTE — Plan of Care (Signed)
  Problem: Education: Goal: Knowledge of General Education information will improve Description: Including pain rating scale, medication(s)/side effects and non-pharmacologic comfort measures Outcome: Progressing   Problem: Health Behavior/Discharge Planning: Goal: Ability to manage health-related needs will improve Outcome: Progressing   Problem: Clinical Measurements: Goal: Ability to maintain clinical measurements within normal limits will improve Outcome: Progressing Goal: Will remain free from infection Outcome: Progressing Goal: Diagnostic test results will improve Outcome: Progressing Goal: Respiratory complications will improve Outcome: Progressing Goal: Cardiovascular complication will be avoided Outcome: Progressing   Problem: Activity: Goal: Risk for activity intolerance will decrease Outcome: Progressing   Problem: Nutrition: Goal: Adequate nutrition will be maintained Outcome: Progressing   Problem: Coping: Goal: Level of anxiety will decrease Outcome: Progressing   Problem: Elimination: Goal: Will not experience complications related to bowel motility Outcome: Progressing Goal: Will not experience complications related to urinary retention Outcome: Progressing   Problem: Safety: Goal: Ability to remain free from injury will improve Outcome: Progressing   Problem: Skin Integrity: Goal: Risk for impaired skin integrity will decrease Outcome: Progressing   Problem: Pain Managment: Goal: General experience of comfort will improve and/or be controlled Outcome: Not Progressing   Problem: Safety: Goal: Non-violent Restraint(s) Outcome: Completed/Met
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
## 2023-03-20 NOTE — Progress Notes (Signed)
 Gen: Patient resting calmly R: clear breathing on 4 l [place] Abd: soft, NT Ext; Left forearm with ex fix in place

## 2023-03-20 NOTE — Progress Notes (Signed)
 Patient assessed by Dr. Damita Lack upon transfer to [facility] Pulses in Right Radial, Right Dorsalis Pedis, and Left Popliteal are +2, and Left Radial are warm, dry, and dopplerable. Patient's questions answered prior to transport. Report given to [employer] personnel prior to transfer of patient to stretcher. EMTALA paperwork provided to proper personnel.

## 2023-03-20 NOTE — Progress Notes (Signed)
 Gen: NAD R: nonlabored Abd: soft Ext; L UE in ex fix
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
## 2023-03-22 ENCOUNTER — Encounter (HOSPITAL_COMMUNITY): Payer: Self-pay

## 2023-04-01 ENCOUNTER — Emergency Department (HOSPITAL_COMMUNITY)
Admission: EM | Admit: 2023-04-01 | Discharge: 2023-04-01 | Disposition: A | Discharge status: Other Acute Inpatient Hospital | Attending: Emergency Medicine | Admitting: Emergency Medicine

## 2023-04-01 ENCOUNTER — Emergency Department (HOSPITAL_COMMUNITY)

## 2023-04-01 ENCOUNTER — Other Ambulatory Visit: Payer: Self-pay

## 2023-04-01 ENCOUNTER — Encounter (HOSPITAL_COMMUNITY): Payer: Self-pay | Admitting: Emergency Medicine

## 2023-04-01 DIAGNOSIS — G8918 Other acute postprocedural pain: Secondary | ICD-10-CM

## 2023-04-01 DIAGNOSIS — M79602 Pain in left arm: Secondary | ICD-10-CM | POA: Diagnosis not present

## 2023-04-01 DIAGNOSIS — M79651 Pain in right thigh: Secondary | ICD-10-CM | POA: Diagnosis not present

## 2023-04-01 LAB — COMPREHENSIVE METABOLIC PANEL
ALT: 21 U/L (ref 0–44)
AST: 39 U/L (ref 15–41)
Albumin: 3.1 g/dL — ABNORMAL LOW (ref 3.5–5.0)
Alkaline Phosphatase: 43 U/L (ref 38–126)
Anion gap: 9 (ref 5–15)
BUN: 11 mg/dL (ref 6–20)
CO2: 25 mmol/L (ref 22–32)
Calcium: 8.8 mg/dL — ABNORMAL LOW (ref 8.9–10.3)
Chloride: 104 mmol/L (ref 98–111)
Creatinine, Ser: 0.86 mg/dL (ref 0.61–1.24)
GFR, Estimated: >60 mL/min (ref >60)
Glucose, Bld: 109 mg/dL — ABNORMAL HIGH (ref 70–99)
Potassium: 4.8 mmol/L (ref 3.5–5.1)
Sodium: 138 mmol/L (ref 135–145)
Total Bilirubin: 0.7 mg/dL (ref 0.0–1.2)
Total Protein: 5.8 g/dL — ABNORMAL LOW (ref 6.5–8.1)

## 2023-04-01 MED ORDER — HYDROMORPHONE HCL 1 MG/ML IJ SOLN
1.0000 mg | Freq: Once | INTRAMUSCULAR | Status: AC
  Administered 2023-04-01: 1 mg via INTRAVENOUS
  Filled 2023-04-01: qty 1

## 2023-04-01 NOTE — ED Notes (Signed)
 Left arm wrapped with xeroform, kerlix, and splint applied.
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
## 2023-04-01 NOTE — ED Notes (Signed)
 Pt provided with urinal

## 2023-04-01 NOTE — ED Notes (Signed)
 [facility] here to transfer patient. Report given to [facility] ED RN.

## 2023-04-01 NOTE — ED Notes (Signed)
 [facility] called for transport.
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
## 2023-09-13 ENCOUNTER — Ambulatory Visit (HOSPITAL_COMMUNITY)
Admission: EM | Admit: 2023-09-13 | Discharge: 2023-09-13 | Disposition: A | Discharge status: Home / Self Care | Attending: Physician Assistant | Admitting: Physician Assistant

## 2023-09-13 ENCOUNTER — Encounter (HOSPITAL_COMMUNITY): Payer: Self-pay | Admitting: Emergency Medicine

## 2023-09-13 DIAGNOSIS — Z202 Contact with and (suspected) exposure to infections with a predominantly sexual mode of transmission: Secondary | ICD-10-CM | POA: Insufficient documentation

## 2023-09-13 MED ORDER — DOXYCYCLINE HYCLATE 100 MG PO CAPS
100.0000 mg | ORAL_CAPSULE | Freq: Two times a day (BID) | ORAL | 0 refills | Status: AC
Start: 2023-09-13 — End: 2023-09-20

## 2023-09-13 NOTE — ED Triage Notes (Signed)
 Pt requesting STD testing due to having an exposure to gonorrhea and chlamydia. Denies any s/s.

## 2023-09-13 NOTE — Discharge Instructions (Signed)
 Take Doxycycline  as prescribed If test results are positive will call and initiate treatment.  Abstain from sexual activity until test results back and treatment is completed.
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
## 2023-09-14 LAB — CYTOLOGY, (ORAL, ANAL, URETHRAL) ANCILLARY ONLY
Chlamydia: NEGATIVE
Comment: NEGATIVE
Comment: NEGATIVE
Comment: NORMAL
Neisseria Gonorrhea: NEGATIVE
Trichomonas: NEGATIVE

## 2023-12-07 ENCOUNTER — Encounter (HOSPITAL_COMMUNITY): Payer: Self-pay | Admitting: *Deleted

## 2023-12-07 ENCOUNTER — Ambulatory Visit (HOSPITAL_COMMUNITY)
Admission: EM | Admit: 2023-12-07 | Discharge: 2023-12-07 | Disposition: A | Discharge status: Home / Self Care | Attending: Family Medicine | Admitting: Family Medicine

## 2023-12-07 DIAGNOSIS — H1032 Unspecified acute conjunctivitis, left eye: Secondary | ICD-10-CM

## 2023-12-07 DIAGNOSIS — H00014 Hordeolum externum left upper eyelid: Secondary | ICD-10-CM

## 2023-12-07 MED ORDER — MIRTAZAPINE 7.5 MG PO TABS: 7.5000 mg | ORAL_TABLET | Freq: Every day | ORAL | 2 refills | Status: AC

## 2023-12-07 MED ORDER — CEPHALEXIN 500 MG PO CAPS: 500.0000 mg | ORAL_CAPSULE | Freq: Two times a day (BID) | ORAL | 0 refills | Status: AC

## 2023-12-07 MED ORDER — GENTAMICIN SULFATE 0.3 % OP SOLN: 2.0000 [drp] | Freq: Three times a day (TID) | OPHTHALMIC | 0 refills | Status: AC

## 2023-12-07 MED ORDER — DIVALPROEX SODIUM 250 MG PO DR TAB: 250.0000 mg | DELAYED_RELEASE_TABLET | Freq: Two times a day (BID) | ORAL | 2 refills | Status: AC

## 2023-12-07 NOTE — ED Triage Notes (Signed)
 Pt states left eye has had pain and swelling X 2 days.   Has been out of his Depakote  and Remeron  he has been out a couple weeks. He states his therapist is going to set him up with an appt but he doesn't have a date yet
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
## 2023-12-07 NOTE — Discharge Instructions (Signed)
 Put gentamicin eyedrops in the affected eye(s) 3 times daily for 5 days.  Cephalexin  500 mg --1 tablet by mouth 2 times daily for 7 days.  Restart your Depakote --1 tablet 2 times daily  Restart your mirtazapine  7.5 mg every bedtime.

## 2024-02-04 ENCOUNTER — Ambulatory Visit (HOSPITAL_COMMUNITY)
Admission: RE | Admit: 2024-02-04 | Discharge: 2024-02-04 | Disposition: A | Discharge status: Home / Self Care | Payer: Self-pay | Source: Ambulatory Visit | Attending: Family Medicine | Admitting: Family Medicine

## 2024-02-04 ENCOUNTER — Encounter (HOSPITAL_COMMUNITY): Payer: Self-pay

## 2024-02-04 VITALS — BP 144/83 | HR 56 | Temp 98.3°F | Resp 16

## 2024-02-04 DIAGNOSIS — L739 Follicular disorder, unspecified: Secondary | ICD-10-CM | POA: Diagnosis present

## 2024-02-04 DIAGNOSIS — H04122 Dry eye syndrome of left lacrimal gland: Secondary | ICD-10-CM | POA: Insufficient documentation

## 2024-02-04 DIAGNOSIS — N489 Disorder of penis, unspecified: Secondary | ICD-10-CM | POA: Diagnosis present

## 2024-02-04 DIAGNOSIS — Z202 Contact with and (suspected) exposure to infections with a predominantly sexual mode of transmission: Secondary | ICD-10-CM | POA: Diagnosis present

## 2024-02-04 LAB — HIV ANTIBODY (ROUTINE TESTING W REFLEX): HIV Screen 4th Generation wRfx: NONREACTIVE

## 2024-02-04 MED ORDER — DOXYCYCLINE HYCLATE 100 MG PO CAPS: 100.0000 mg | ORAL_CAPSULE | Freq: Two times a day (BID) | ORAL | 0 refills | Status: AC

## 2024-02-04 NOTE — Discharge Instructions (Signed)
 Take doxycycline  100 mg --1 capsule 2 times daily for 7 days; this is for infection of your skin  Staff will notify you if there is anything positive on the swabs (the swab tests for gonorrhea, chlamydia, trichomonas and the other swab tests for herpes) or on the blood work. It can take 2-3 days for the tests to result, depending on the day of the week your test was taken. You will only be notified if there are any positives on the testing; test results will also go to your MyChart if you are signed up for MyChart.
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
## 2024-02-04 NOTE — ED Triage Notes (Signed)
 Pt c/o a sore with drainage and scab to upper pubic area x1 wk. Pt requesting STD testing with blood work. Denies sx's.

## 2024-02-05 LAB — SYPHILIS: RPR W/REFLEX TO RPR TITER AND TREPONEMAL ANTIBODIES, TRADITIONAL SCREENING AND DIAGNOSIS ALGORITHM: RPR Ser Ql: NONREACTIVE

## 2024-02-05 LAB — HSV 1/2 PCR (SURFACE)
HSV-1 DNA: NOT DETECTED
HSV-2 DNA: DETECTED — AB

## 2024-02-07 ENCOUNTER — Ambulatory Visit (HOSPITAL_COMMUNITY): Payer: Self-pay | Admitting: Family Medicine

## 2024-02-08 LAB — CYTOLOGY, (ORAL, ANAL, URETHRAL) ANCILLARY ONLY
Chlamydia: NEGATIVE
Comment: NEGATIVE
Comment: NEGATIVE
Comment: NORMAL
Neisseria Gonorrhea: NEGATIVE
Trichomonas: NEGATIVE
